# Patient Record
Sex: Male | Born: 2000 | Race: White | Hispanic: No | Marital: Single | Smoking: Never smoker
Health system: Southern US, Community
[De-identification: ages and names within clinical notes are randomized; demographics above are authoritative.]

## PROBLEM LIST (undated history)

## (undated) DIAGNOSIS — Z91030 Bee allergy status: Secondary | ICD-10-CM

---
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
## 2022-03-27 ENCOUNTER — Ambulatory Visit
Admission: EM | Admit: 2022-03-27 | Discharge: 2022-03-27 | Disposition: A | Discharge status: Home / Self Care | Payer: Managed Care, Other (non HMO) | Attending: Emergency Medicine | Admitting: Emergency Medicine

## 2022-03-27 DIAGNOSIS — H6123 Impacted cerumen, bilateral: Secondary | ICD-10-CM

## 2022-03-27 NOTE — Discharge Instructions (Addendum)
Continue Debrox, let it sit in there for 10 to 20 minutes, then try irrigation.  If this is not successful,  then please follow-up with [facility].  Use a half-and-half mixture of white vinegar and rubbing alcohol in your ears after you swim to prevent swimmer's ear. ?

## 2022-03-27 NOTE — ED Triage Notes (Signed)
Patient is here for "Ear Fullness". A lot of wax in ears. No injury. Now starting to hurt. No fever. No cold symptoms.  ?

## 2022-03-27 NOTE — ED Provider Notes (Signed)
HPI ? ?SUBJECTIVE: ? ?Steven Hansen is a 21 y.o. male who presents with 2 weeks of gradually decreasing hearing, right ear pain.  He wears ear buds frequently.  Denies other foreign body insertion.  He went swimming in a river before his symptoms started.  No fevers, otorrhea.  He tried Debrox eardrops and irrigating his ears with worsening of his symptoms.  No alleviating factors.  He has no past medical history.  PCP: [facility] ? ? ? ?Past Medical History:  ?Diagnosis Date  ? Bee allergy status   ? ? ?History reviewed. No pertinent surgical history. ? ?History reviewed. No pertinent family history. ? ?Social History  ? ?Tobacco Use  ? Smoking status: Never  ? Smokeless tobacco: Never  ?Vaping Use  ? Vaping Use: Never used  ?Substance Use Topics  ? Alcohol use: No  ? Drug use: No  ? ? ?No current facility-administered medications for this encounter. ? ?Current Outpatient Medications:  ?  EPINEPHrine 0.3 mg/0.3 mL IJ SOAJ injection, Inject into the muscle., Disp: , Rfl:  ?  escitalopram (LEXAPRO) 20 MG tablet, Take by mouth., Disp: , Rfl:  ?  ipratropium (ATROVENT) 0.06 % nasal spray, Place into the nose., Disp: , Rfl:  ?  EPINEPHrine (EPIPEN JR) 0.15 MG/0.3ML injection, Inject into the muscle., Disp: , Rfl:  ?  EPINEPHrine 0.3 mg/0.3 mL IJ SOAJ injection, Inject into the muscle once., Disp: , Rfl:  ?  escitalopram (LEXAPRO) 10 MG tablet, Take 1 tablet by mouth daily., Disp: , Rfl:  ?  gentamicin (GARAMYCIN) 0.3 % ophthalmic solution, Place 2 drops into the right eye 3 (three) times daily., Disp: 5 mL, Rfl: 0 ?  ibuprofen (ADVIL) 200 MG tablet, Take by mouth., Disp: , Rfl:  ?  mupirocin ointment (BACTROBAN) 2 %, Apply 1 application topically 3 (three) times daily., Disp: 22 g, Rfl: 0 ?  ondansetron (ZOFRAN) 4 MG tablet, Take 1 tablet (4 mg total) by mouth every 6 (six) hours., Disp: 12 tablet, Rfl: 0 ? ?Allergies  ?Allergen Reactions  ? Bee Pollen Anaphylaxis  ? Bee Venom Shortness Of Breath   ? ? ? ?ROS ? ?As noted in HPI.  ? ?Physical Exam ? ?BP 134/65 (BP Location: Left Arm)   Pulse 62   Temp 97.8 ?F (36.6 ?C) (Oral)   Resp 16   Ht 6\' 3"  (1.905 m)   Wt 104.3 kg   SpO2 99%   BMI 28.75 kg/m?  ? ?Constitutional: Well developed, well nourished, no acute distress ?Eyes:  EOMI, conjunctiva normal bilaterally ?HENT: Normocephalic, atraumatic,mucus membranes moist.  Hearing intact and equal, but decreased in both ears.  No pain with traction on pinna, palpation of tragus, palpation of mastoid bilaterally.  EAC appears normal,  No pain with speculum exam bilaterally.  Bilateral cerumen impaction. ?Respiratory: Normal inspiratory effort ?Cardiovascular: Normal rate ?GI: nondistended ?skin: No rash, skin intact ?Musculoskeletal: no deformities ?Neurologic: Alert & oriented x 3, no focal neuro deficits ?Psychiatric: Speech and behavior appropriate ? ? ?ED Course ? ? ?Medications - No data to display ? ?Orders Placed This Encounter  ?Procedures  ? Ear wax removal  ?  Bilateral  ?  Standing Status:   Standing  ?  Number of Occurrences:   1  ? ? ?No results found for this or any previous visit (from the past 24 hour(s)). ?No results found. ? ?ED Clinical Impression ? ?1. Bilateral hearing loss due to cerumen impaction   ?  ? ?ED Assessment/Plan ? ?  Patient with bilateral cerumen impaction . will have ears irrigated and reevaluate. ? ?On reevaluation, patient states that his right ear feels better.  Right EAC, TM normal and intact.  Hearing improved.  No evidence of otitis externa.  Patient still has some cerumen impaction in his left ear.  Was able to remove some, but not a significant amount of wax with a curette.  Continue Debrox, home irrigation.  If this does not work, follow-up with ENT.  ? ? ?Discussed  MDM, treatment plan, and plan for follow-up with patient. patient agrees with plan.  ? ?No orders of the defined types were placed in this encounter. ? ? ? ? ?*This clinic note was created using Administrator, sports. Therefore, there may be occasional mistakes despite careful proofreading. ? ?? ? ?  ?Domenick Gong, MD ?03/28/22 1947 ? ?
